# Patient Record
Sex: Female | Born: 1955 | Race: White | Hispanic: No | Marital: Married | Smoking: Never smoker
Health system: Southern US, Community
[De-identification: ages and names within clinical notes are randomized; demographics above are authoritative.]

## PROBLEM LIST (undated history)

## (undated) HISTORY — PX: WISDOM TOOTH EXTRACTION: SHX21

## (undated) HISTORY — PX: TUBAL LIGATION: SHX77

---

## 2002-08-17 DIAGNOSIS — K589 Irritable bowel syndrome without diarrhea: Secondary | ICD-10-CM | POA: Insufficient documentation

## 2005-08-05 ENCOUNTER — Ambulatory Visit: Payer: Self-pay | Admitting: Family Medicine

## 2006-11-30 ENCOUNTER — Ambulatory Visit: Payer: Self-pay | Admitting: Family Medicine

## 2008-05-03 ENCOUNTER — Ambulatory Visit: Payer: Self-pay | Admitting: Family Medicine

## 2008-12-14 ENCOUNTER — Emergency Department: Payer: Self-pay | Admitting: Emergency Medicine

## 2008-12-17 ENCOUNTER — Emergency Department: Payer: Self-pay | Admitting: Emergency Medicine

## 2008-12-21 ENCOUNTER — Emergency Department: Payer: Self-pay | Admitting: Internal Medicine

## 2008-12-28 ENCOUNTER — Emergency Department: Payer: Self-pay | Admitting: Emergency Medicine

## 2009-01-11 ENCOUNTER — Emergency Department: Payer: Self-pay | Admitting: Emergency Medicine

## 2009-03-07 ENCOUNTER — Ambulatory Visit: Payer: Self-pay | Admitting: Family Medicine

## 2009-05-16 ENCOUNTER — Ambulatory Visit: Payer: Self-pay | Admitting: Family Medicine

## 2009-08-13 ENCOUNTER — Ambulatory Visit: Payer: Self-pay | Admitting: General Surgery

## 2010-08-06 ENCOUNTER — Ambulatory Visit: Payer: Self-pay | Admitting: Family Medicine

## 2011-06-01 IMAGING — CR DG LUMBAR SPINE 2-3V
1 series · 3 of 3 positions shown · non-contrast
Comparison: none

REASON FOR EXAM: Zae Avalo pain
COMMENTS:

PROCEDURE:     KDR - KDXR LUMBAR SPINE AP AND LATERAL  - March 07, 2009  [DATE]
RESULT:     Spinal alignment is normal. The vertebral body heights are
maintained. There is no fracture or congenital abnormality evident.

[Series 2: view not recorded · 0.17mm/px · 3 of 3 slices shown]
[im 1/3]
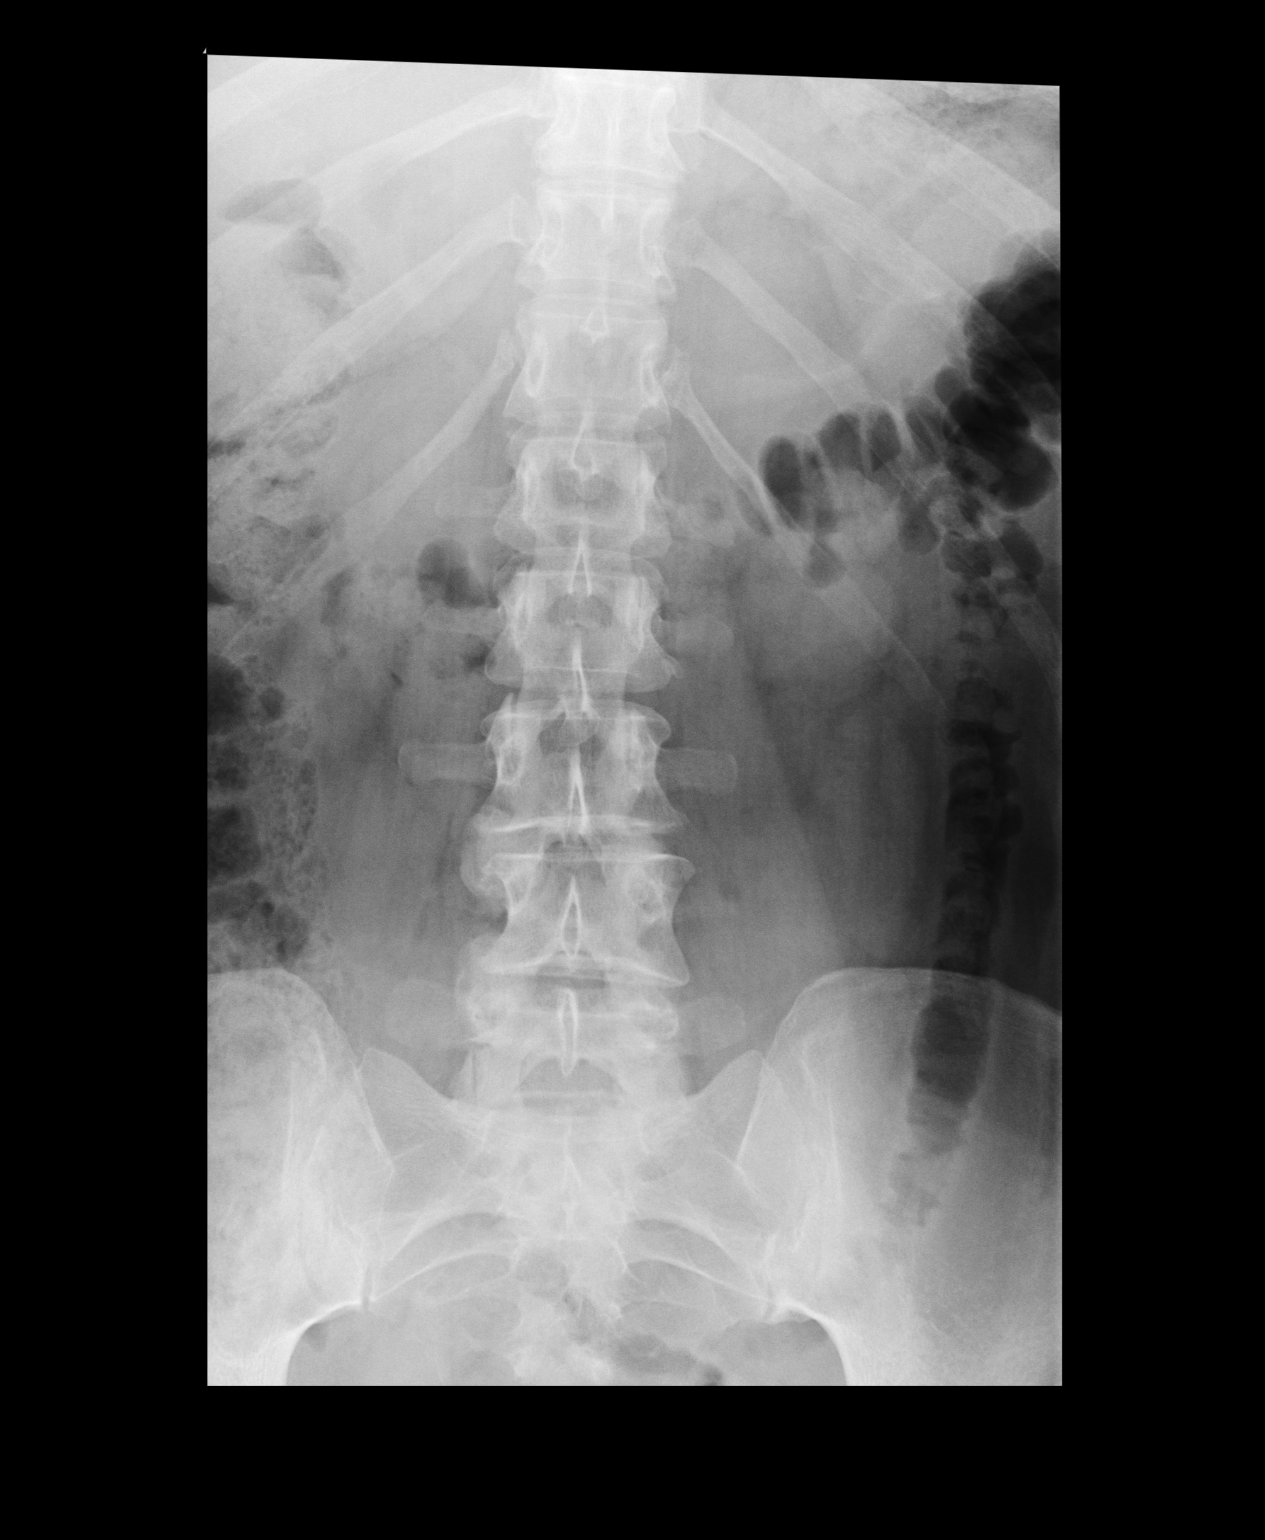
[im 2/3]
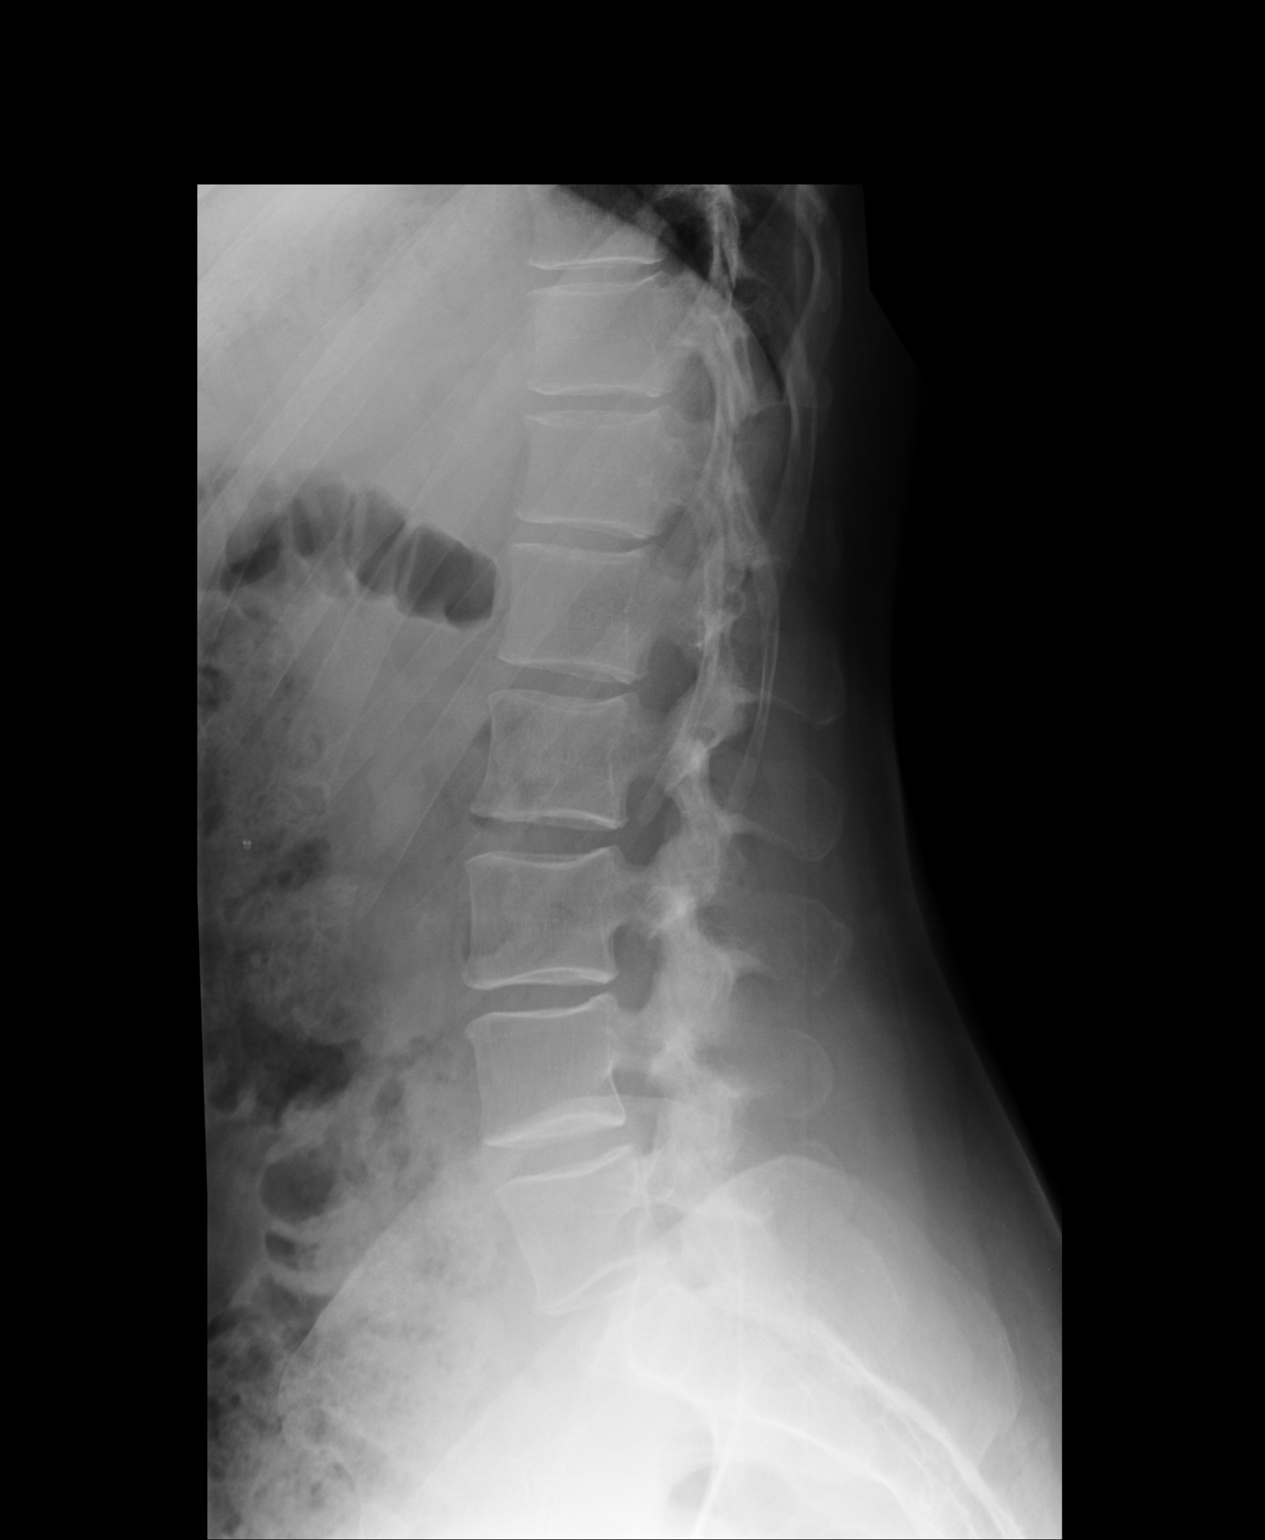
[im 3/3]
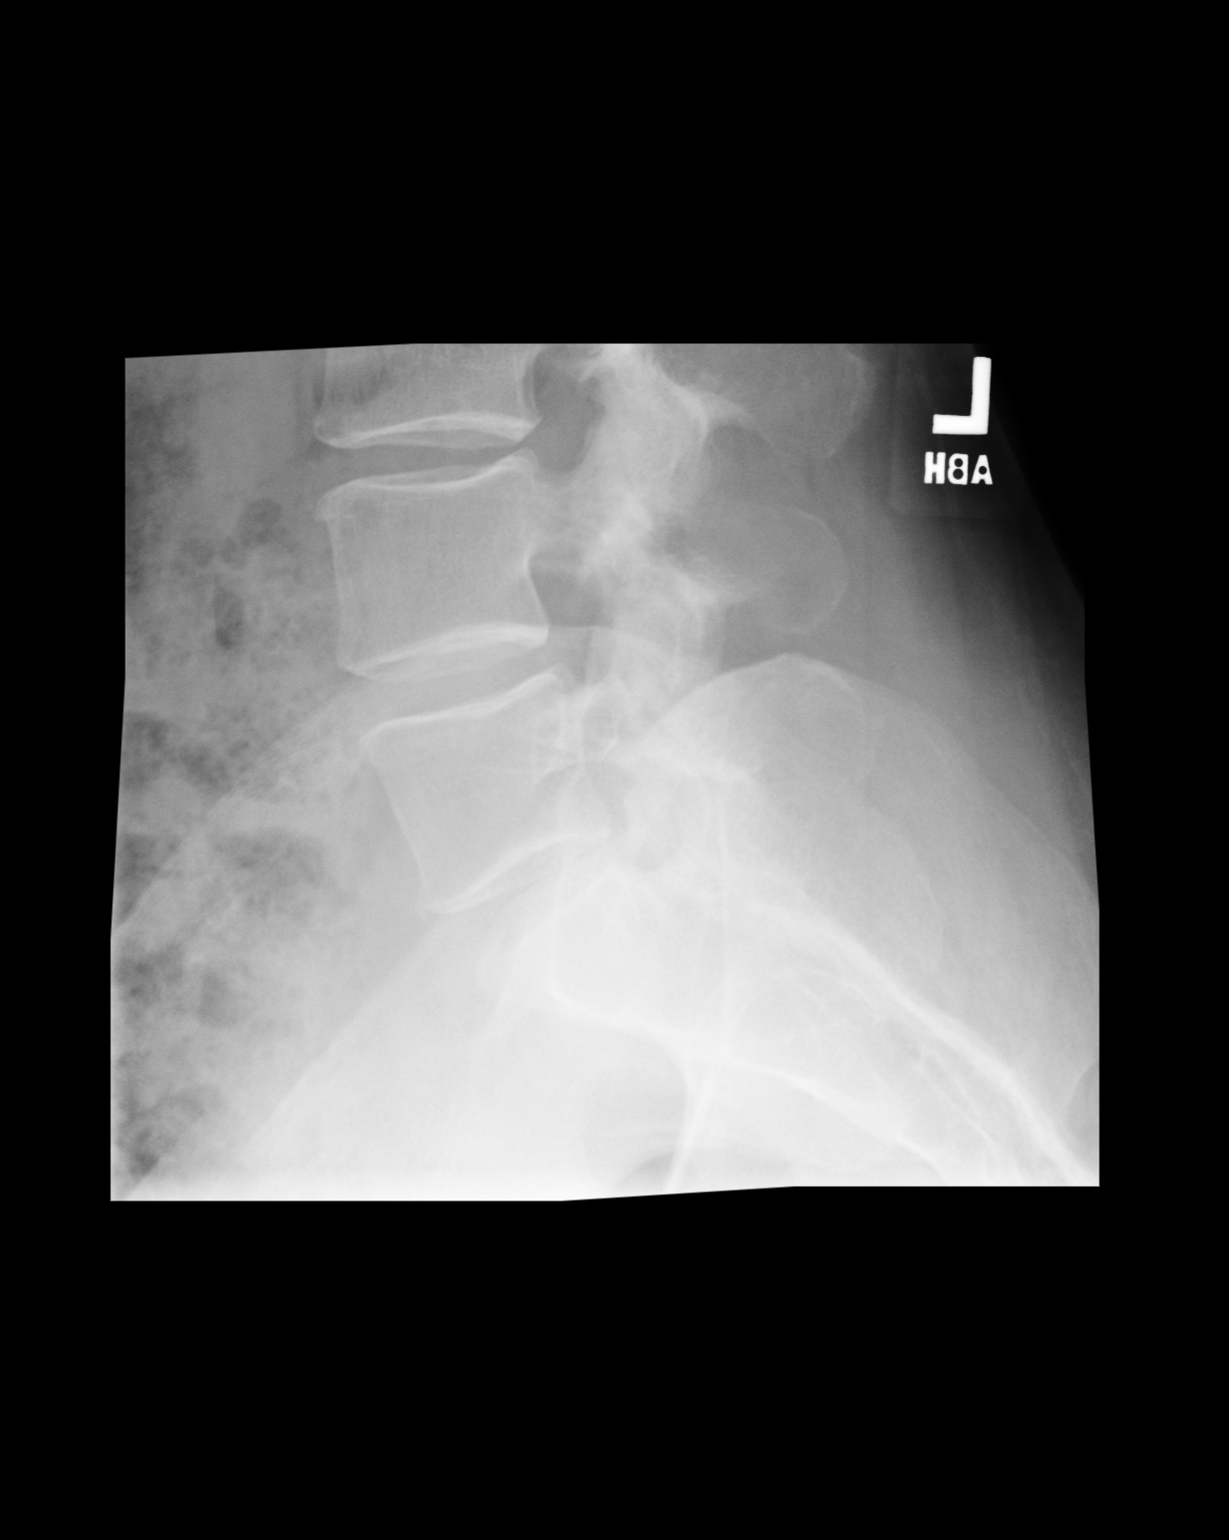

[3 of 3 positions shown; findings below may reference images not displayed]

IMPRESSION: No acute bony abnormality. MRI followup may be beneficial
area

## 2011-08-10 IMAGING — MG MAM DGTL SCREENING MAMMO W/CAD
1 series · 4 of 4 positions shown · non-contrast
Comparison: none

REASON FOR EXAM: scr
COMMENTS:

PROCEDURE:     MAM - MAM DGTL SCREENING MAMMO W/CAD  - May 16, 2009  [DATE]
RESULT:       Comparison is made to prior examinations of 05/03/08, 11/30/06 and
09/15/01.
There are scattered fibroglandular densities bilaterally.  No mass or
malignant-appearing calcifications are seen.

[R CC · right · 4 of 4 slices shown]
[im 1/4]
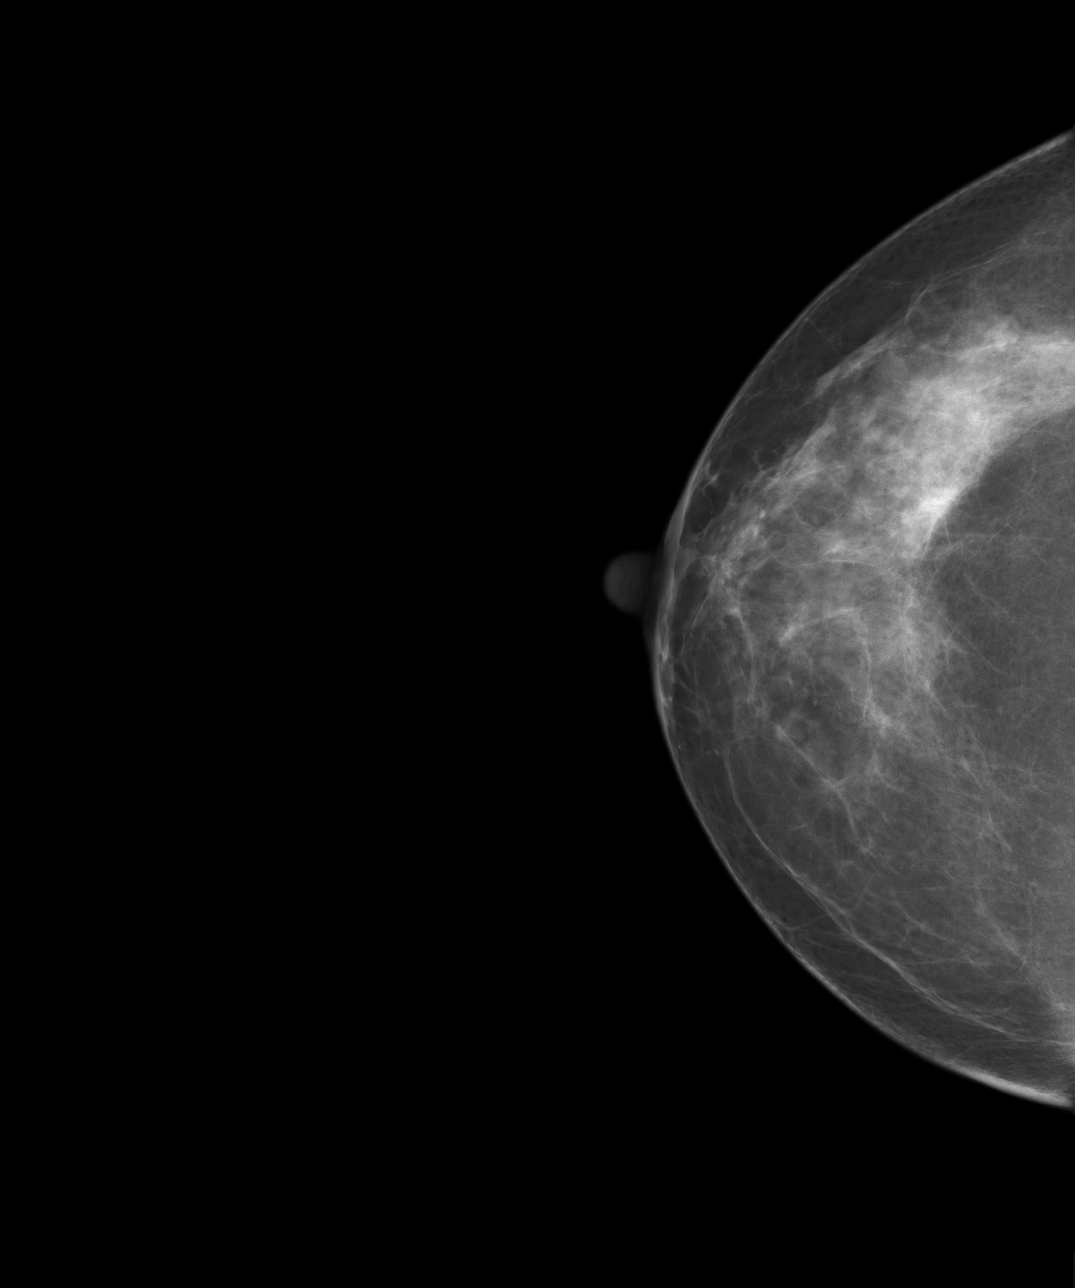
[im 2/4]
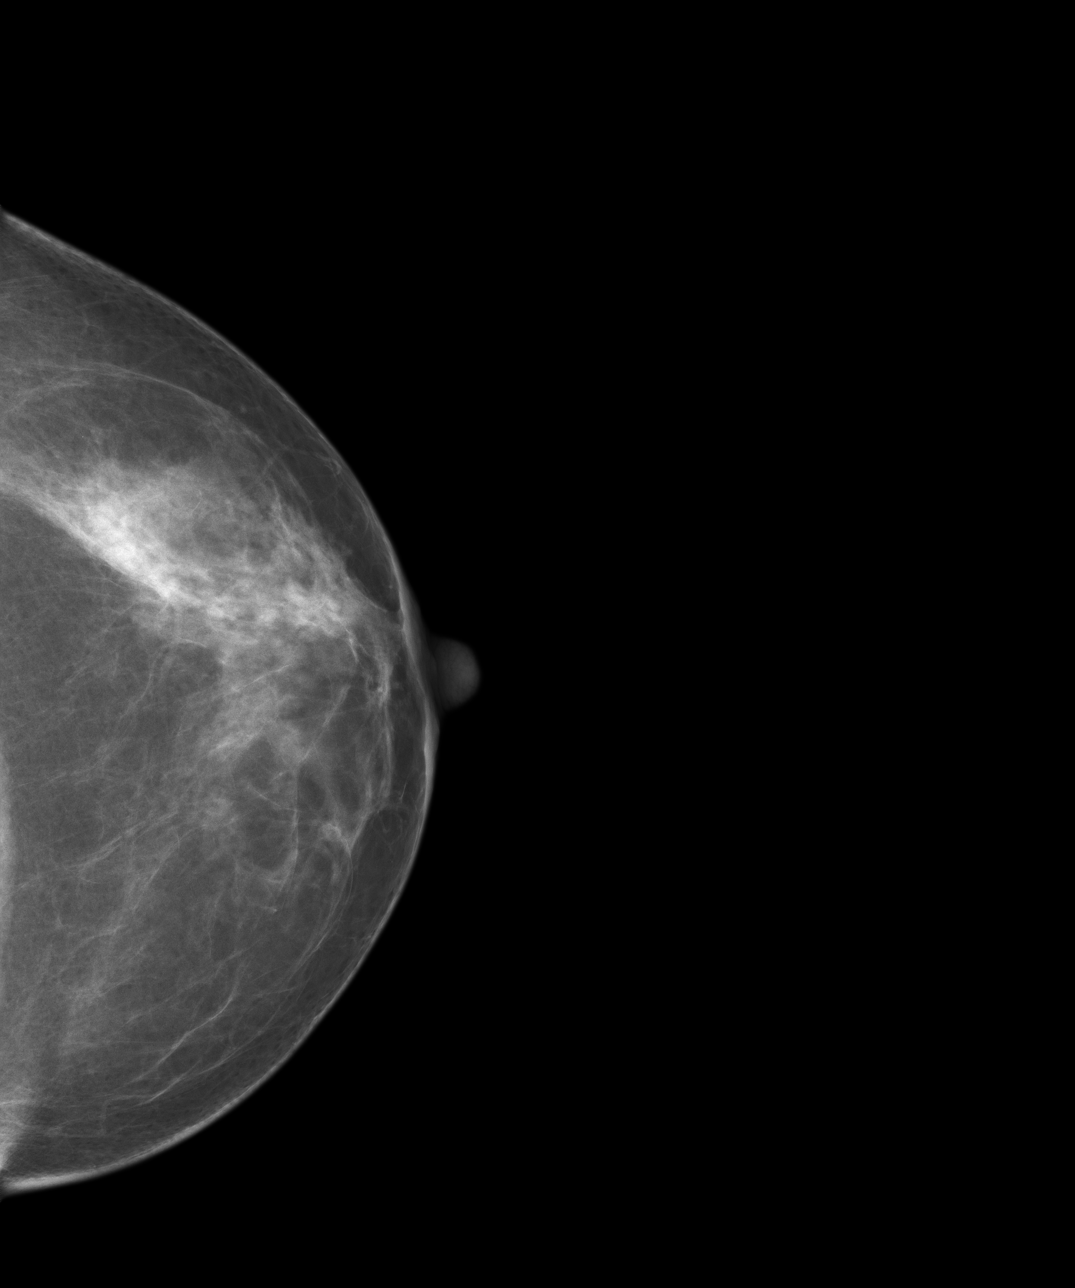
[im 3/4]
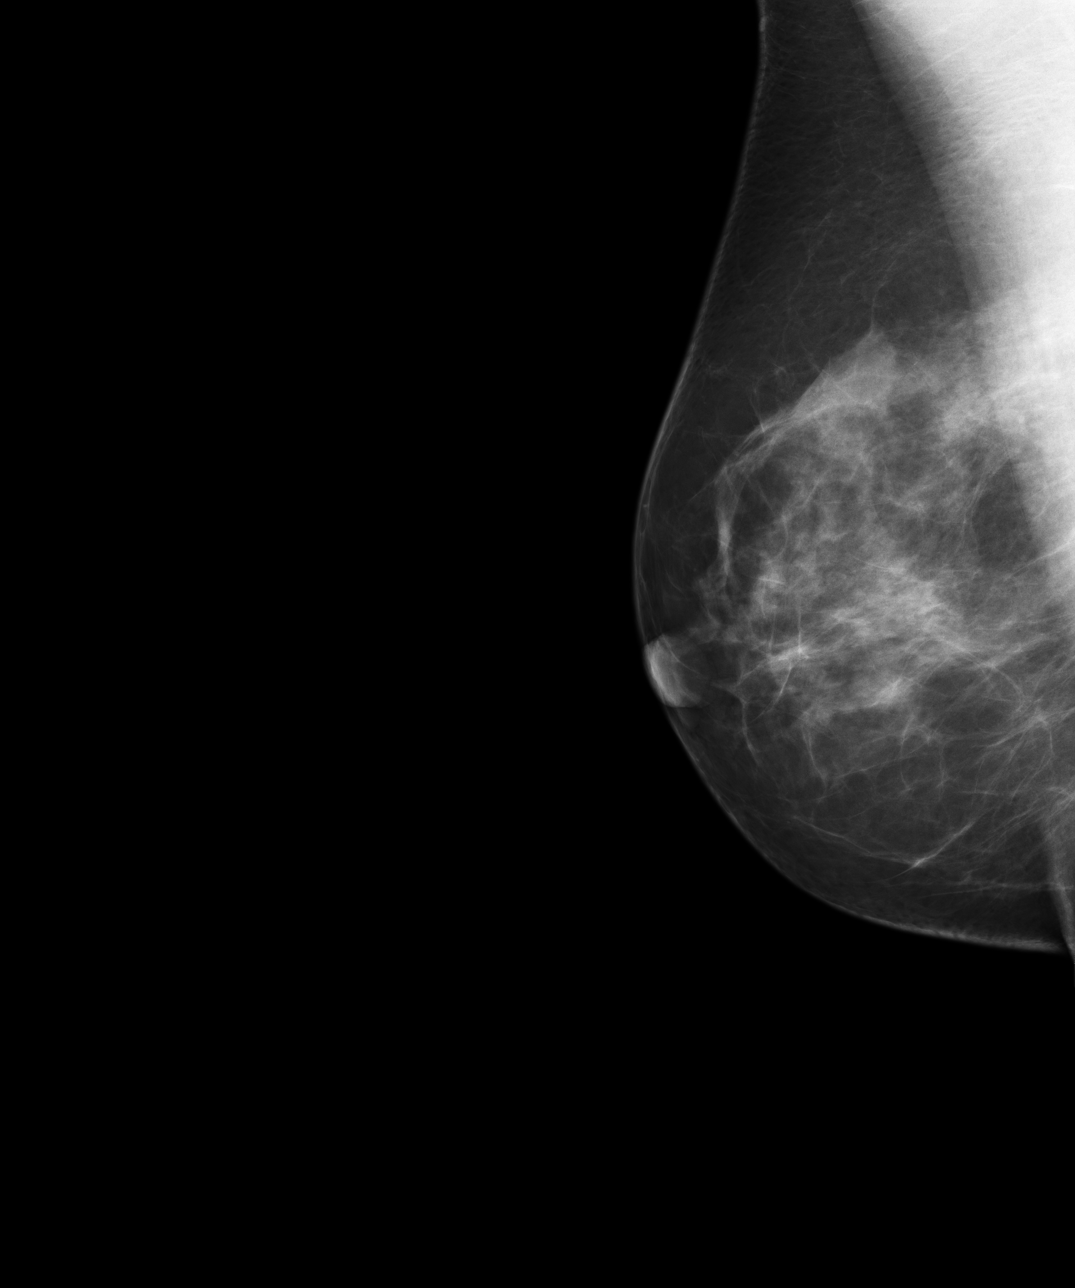
[im 4/4]
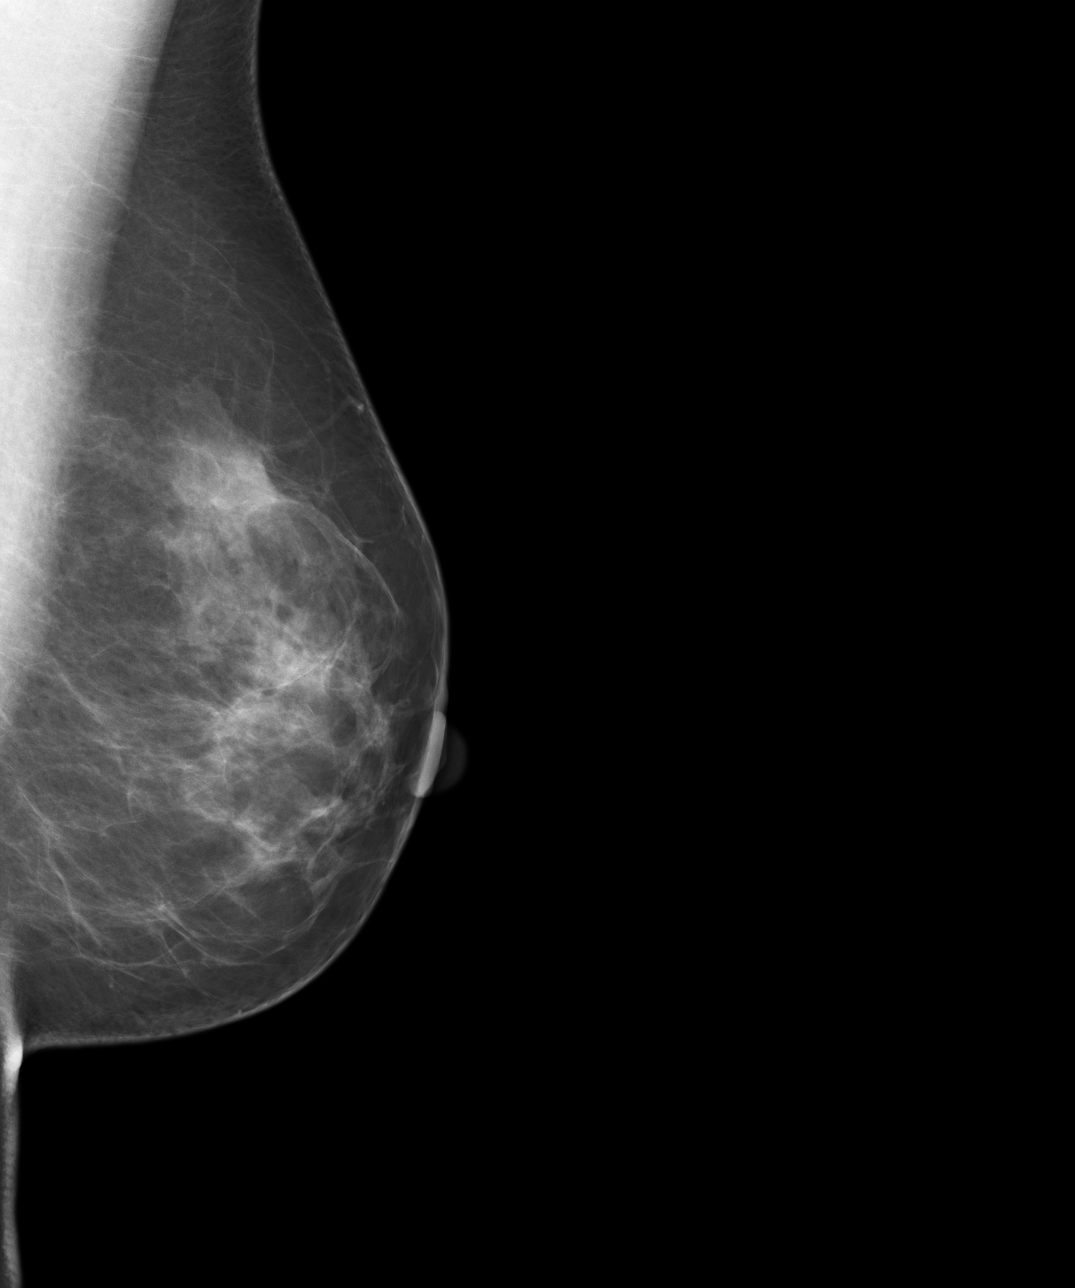

[4 of 4 positions shown; findings below may reference images not displayed]

IMPRESSION: 1.     Bilateral benign-appearing screening mammography.
2.     Continued annual screening mammography is recommended.
3.     BI-RADS: Category 1-Negative.

A negative mammogram report does not preclude biopsy or other evaluation of
a clinically palpable or otherwise suspicious mass or lesion. Breast cancer
may not be detected by mammography in up to 10% of cases.

## 2012-07-05 ENCOUNTER — Ambulatory Visit: Payer: Self-pay | Admitting: Family Medicine

## 2012-10-29 IMAGING — MG MAM DGTL SCREENING MAMMO W/CAD
1 series · 4 of 4 positions shown · non-contrast
Comparison: none

REASON FOR EXAM: scr
COMMENTS:

[R CC · right · 4 of 4 slices shown]
[im 1/4]
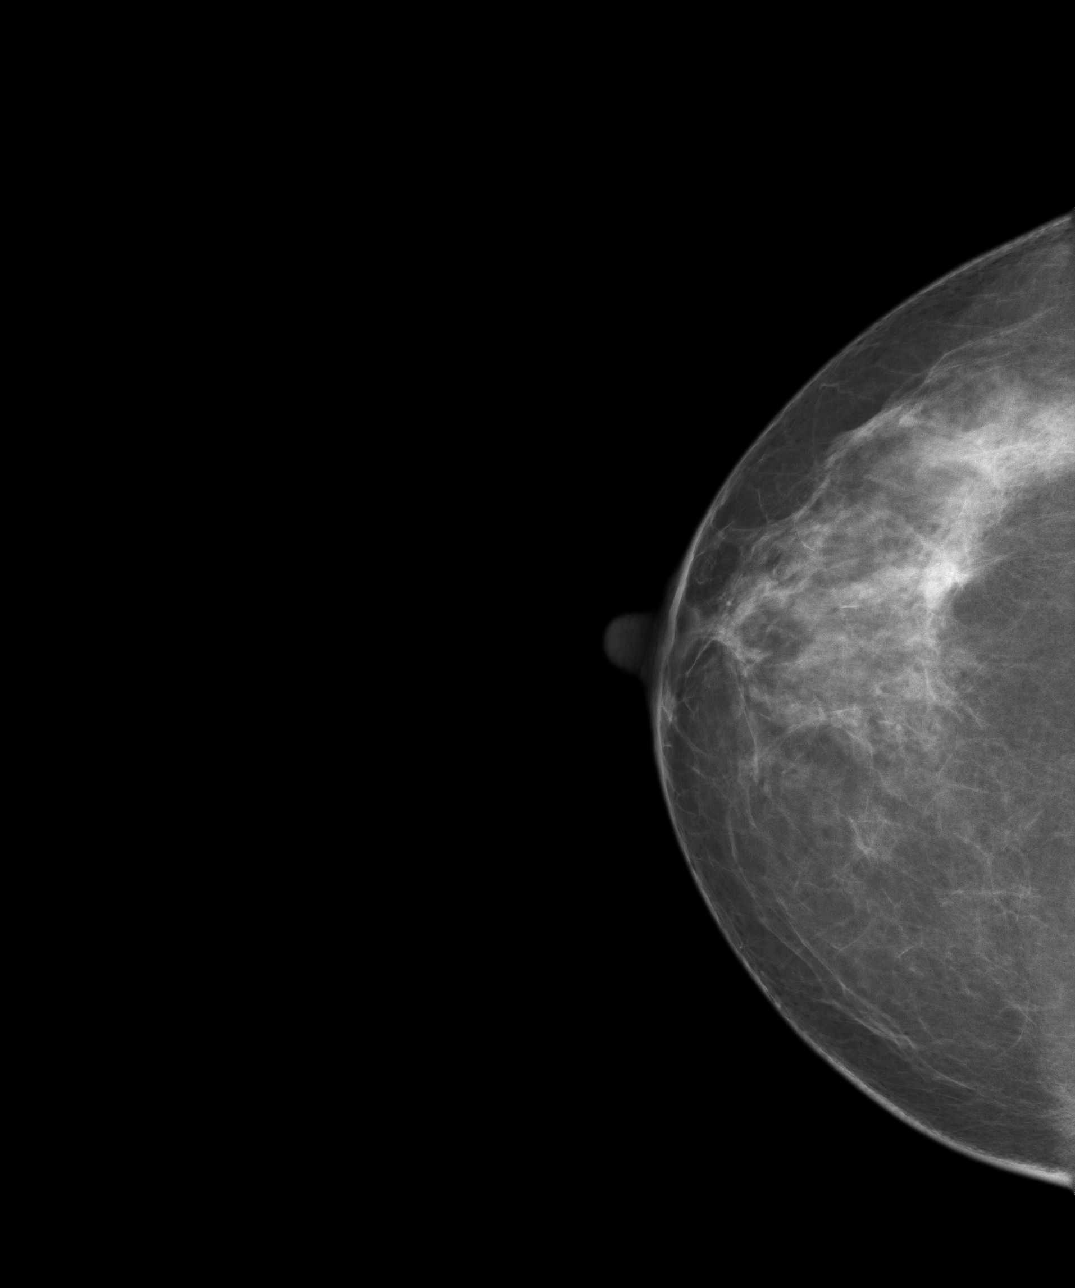
[im 2/4]
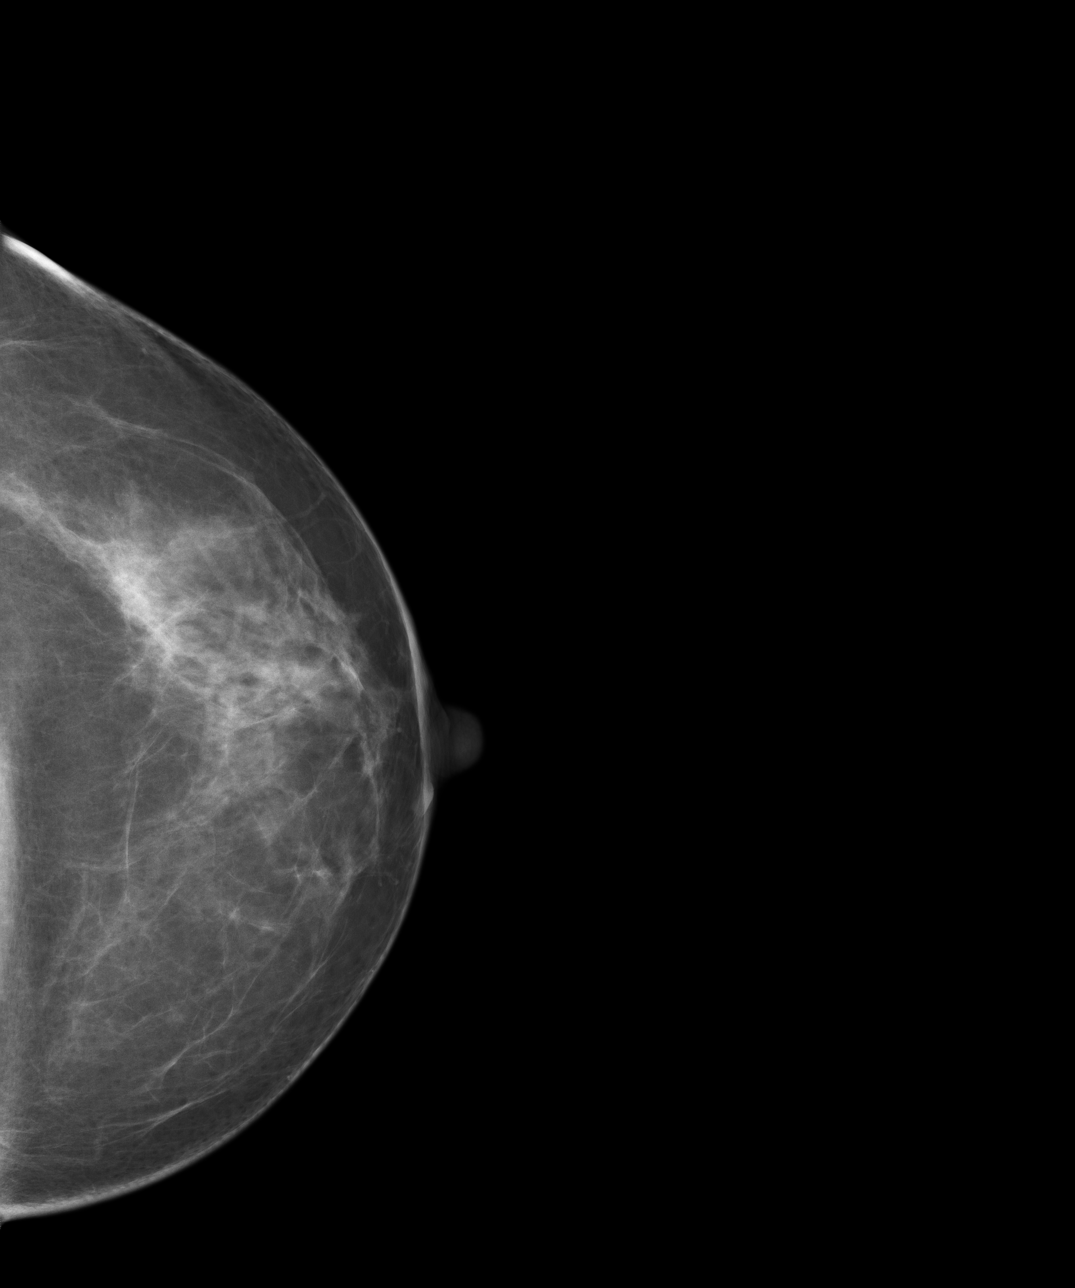
[im 3/4]
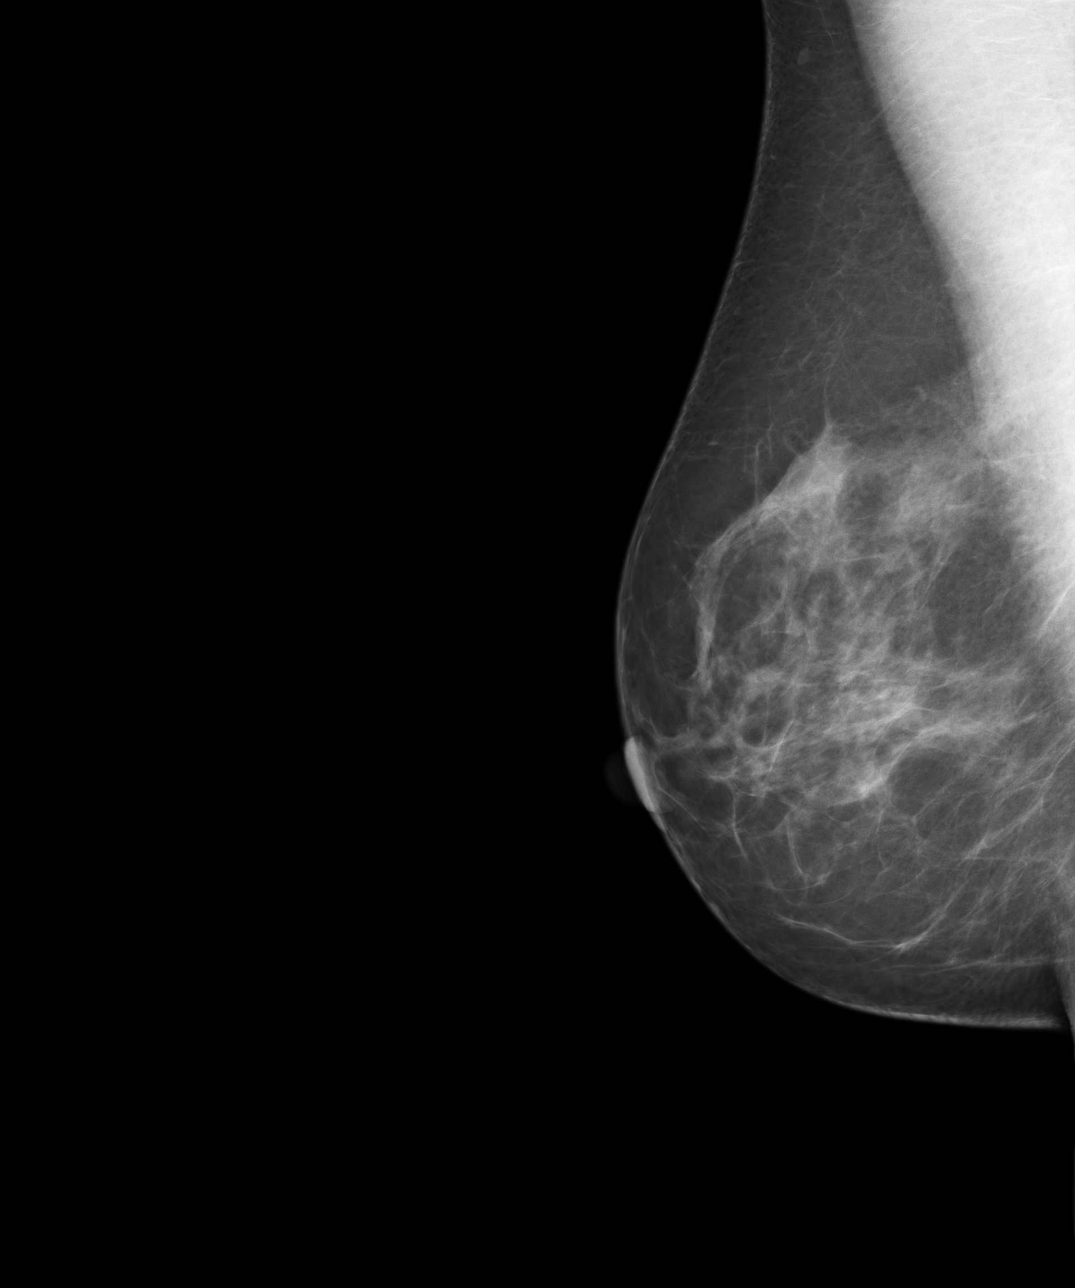
[im 4/4]
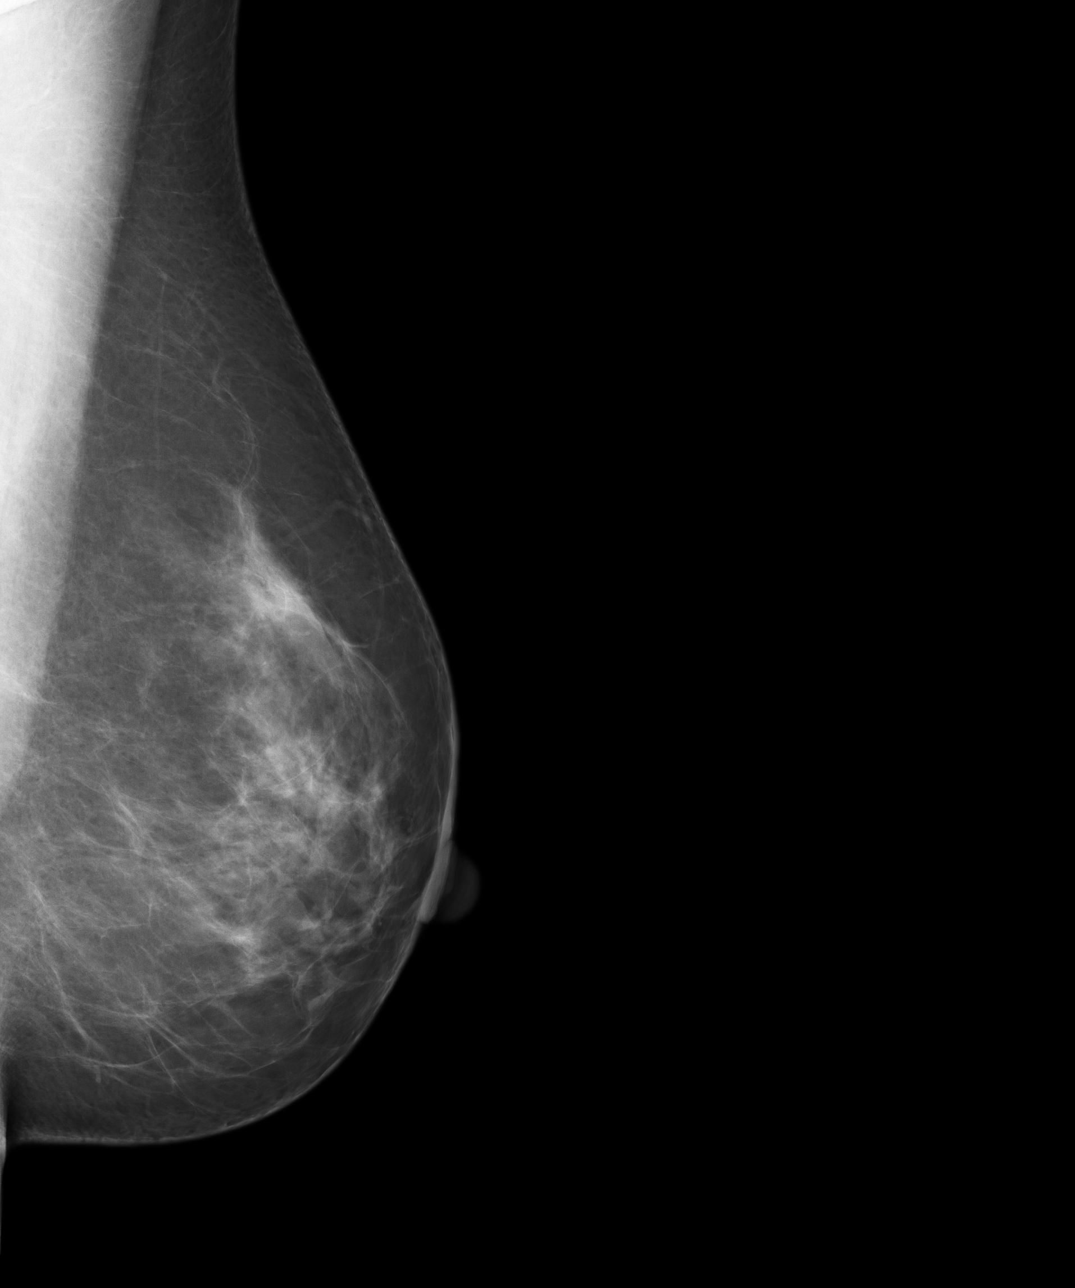

[4 of 4 positions shown; findings below may reference images not displayed]

PROCEDURE:     MAM - MAM DGTL SCREENING MAMMO W/CAD  - August 06, 2010  [DATE]

RESULT:     Comparison is made to the previous digital images of 05-16-09 as
well as 05-03-08 and 09-15-01.  The breasts exhibit a moderately dense
parenchymal pattern which is stable mammographically. There is no developing
parenchymal density or dominant mass. No malignant calcification or
architectural distortion is present.
IMPRESSION: 1.Stable, benign appearing bilateral mammogram.

BI-RADS: Category 2 - Benign Findings

RECOMMENDATIONS:

1.     Please continue to encourage yearly mammographic follow-up.

A NEGATIVE MAMMOGRAM REPORT DOES NOT PRECLUDE BIOPSY OR OTHER EVALUATION OF
A CLINICALLY PALPABLE OR OTHERWISE SUSPICIOUS MASS OR LESION. BREAST CANCER
MAY NOT BE DETECTED BY MAMMOGRAPHY IN UP TO 10% OF CASES.

## 2014-04-19 LAB — HEPATIC FUNCTION PANEL
ALT: 21 U/L (ref 7–35)
AST: 23 U/L (ref 13–35)
Alkaline Phosphatase: 92 U/L (ref 25–125)
BILIRUBIN, TOTAL: 0.4 mg/dL

## 2014-04-19 LAB — TSH: TSH: 2.2 u[IU]/mL (ref 0.41–5.90)

## 2014-04-19 LAB — CBC AND DIFFERENTIAL
HCT: 43 % (ref 36–46)
HEMOGLOBIN: 14.9 g/dL (ref 12.0–16.0)
Neutrophils Absolute: 4 /uL
Platelets: 222 10*3/uL (ref 150–399)
WBC: 5.9 10^3/mL

## 2014-04-19 LAB — BASIC METABOLIC PANEL
BUN: 13 mg/dL (ref 4–21)
CREATININE: 0.9 mg/dL (ref 0.5–1.1)
Glucose: 85 mg/dL
POTASSIUM: 4.4 mmol/L (ref 3.4–5.3)
Sodium: 140 mmol/L (ref 137–147)

## 2014-04-19 LAB — LIPID PANEL
Cholesterol: 207 mg/dL — AB (ref 0–200)
HDL: 67 mg/dL (ref 35–70)
LDL Cholesterol: 122 mg/dL
Triglycerides: 91 mg/dL (ref 40–160)

## 2014-05-15 ENCOUNTER — Ambulatory Visit: Payer: Self-pay | Admitting: Family Medicine

## 2015-08-02 ENCOUNTER — Encounter: Payer: Self-pay | Admitting: Family Medicine

## 2015-08-02 ENCOUNTER — Ambulatory Visit (INDEPENDENT_AMBULATORY_CARE_PROVIDER_SITE_OTHER): Payer: 59 | Admitting: Family Medicine

## 2015-08-02 VITALS — BP 100/70 | HR 80 | Temp 98.3°F | Resp 16 | Ht 63.0 in | Wt 134.0 lb

## 2015-08-02 DIAGNOSIS — Z0000 Encounter for general adult medical examination without abnormal findings: Secondary | ICD-10-CM | POA: Diagnosis not present

## 2015-08-02 DIAGNOSIS — Z1239 Encounter for other screening for malignant neoplasm of breast: Secondary | ICD-10-CM | POA: Diagnosis not present

## 2015-08-02 DIAGNOSIS — Z1211 Encounter for screening for malignant neoplasm of colon: Secondary | ICD-10-CM

## 2015-08-02 DIAGNOSIS — E559 Vitamin D deficiency, unspecified: Secondary | ICD-10-CM | POA: Insufficient documentation

## 2015-08-02 LAB — POCT URINALYSIS DIPSTICK
Bilirubin, UA: NEGATIVE
Blood, UA: NEGATIVE
GLUCOSE UA: NEGATIVE
Ketones, UA: NEGATIVE
Leukocytes, UA: NEGATIVE
NITRITE UA: NEGATIVE
PROTEIN UA: NEGATIVE
Spec Grav, UA: 1.015
UROBILINOGEN UA: 0.2
pH, UA: 6

## 2015-08-02 LAB — IFOBT (OCCULT BLOOD): IFOBT: NEGATIVE

## 2015-08-02 NOTE — Progress Notes (Signed)
Patient ID: Melissa Glass, female   DOB: July 01, 1955, 60 y.o.   MRN: [id number]       Patient: Melissa Glass, Female    DOB: July 01, 1955, 60 y.o.   MRN: [id number] Visit Date: 08/02/2015  Today's Provider: Lorie PhenixNancy Chauncey Bruno, MD   Chief Complaint  Patient presents with  . Annual Exam   Subjective:    Annual physical exam Melissa Meiersancy A Keep is a 60 y.o. female who presents today for health maintenance and complete physical. She feels well. She reports exercising daily. She reports she is sleeping well. 04/13/14 CPE 04/13/14 Pap-neg; HPV-neg 05/15/14 Mammogram-BI-RADS 1 08/13/09 Colonoscopy-diverticulosis, recheck in 10 yrs. Dr. Lemar LivingsByrnett -----------------------------------------------------------------  Review of Systems  Constitutional: Negative.   HENT: Negative.   Eyes: Negative.   Respiratory: Negative.   Cardiovascular: Negative.   Gastrointestinal: Negative.   Endocrine: Negative.   Genitourinary: Negative.   Musculoskeletal: Negative.   Skin: Negative.   Allergic/Immunologic: Negative.   Neurological: Negative.   Hematological: Negative.   Psychiatric/Behavioral: Negative.     Social History      She  reports that she has never smoked. She has never used smokeless tobacco. She reports that she drinks about 4.2 oz of alcohol per week. She reports that she does not use illicit drugs.       Social History   Social History  . Marital Status: Married    Spouse Name: N/A  . Number of Children: N/A  . Years of Education: N/A   Social History Main Topics  . Smoking status: Never Smoker   . Smokeless tobacco: Never Used  . Alcohol Use: 4.2 oz/week    7 Glasses of wine per week  . Drug Use: No  . Sexual Activity: Not Asked   Other Topics Concern  . None   Social History Narrative    History reviewed. No pertinent past medical history.   Patient Active Problem List   Diagnosis Date Noted  . Arthritis, degenerative 03/07/2009  . Menstrual molimen 05/24/2008  .  Menopausal symptom 05/24/2008  . 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 Tubal ligation    . Wisdom tooth extraction      Family History        Family Status  Relation Status Death Age  . Mother Alive   . Father Deceased   . Brother Alive   . Maternal Grandmother Deceased   . Maternal Grandfather Deceased   . Paternal Grandmother Deceased   . Paternal Grandfather Deceased   . Brother Alive         Her family history includes Alzheimer's disease in her paternal grandfather; Arthritis in her mother; Breast cancer in her mother; Heart attack in her paternal grandmother; Heart disease in her father; Hypertension in her brother and mother; Lupus in her mother; Parkinson's disease in her father; Pneumonia in her maternal grandfather; Stroke in her father.    Allergies  Allergen Reactions  . Hair Formula Extra Strength  [Actical] Swelling    Ingredient: Paraphenylenediamine  . Sulfa Antibiotics     Previous Medications   MISC NATURAL PRODUCTS (FIBER 7 PO)        Patient Care Team: Lorie PhenixNancy Adonys Wildes, MD as PCP - General (Family Medicine)     Objective:   Vitals: BP 100/70 mmHg  Pulse 80  Temp(Src) 98.3 F (36.8 C) (Oral)  Resp 16  Ht 5\' 3"  (1.6 m)  Wt 134 lb (60.782 kg)  BMI 23.74 kg/m2  Physical Exam  Constitutional: She is oriented to person, place, and time. She appears well-developed and well-nourished.  HENT:  Head: Normocephalic and atraumatic.  Right Ear: Tympanic membrane, external ear and ear canal normal.  Left Ear: Tympanic membrane, external ear and ear canal normal.  Nose: Nose normal.  Mouth/Throat: Uvula is midline, oropharynx is clear and moist and mucous membranes are normal.  Eyes: Conjunctivae, EOM and lids are normal. Pupils are equal, round, and reactive to light.  Neck: Trachea normal and normal range of motion. Neck supple. Carotid bruit is not present. No thyroid mass and no thyromegaly present.    Cardiovascular: Normal rate, regular rhythm and normal heart sounds.   Pulmonary/Chest: Effort normal and breath sounds normal.  Abdominal: Soft. Normal appearance and bowel sounds are normal. There is no hepatosplenomegaly. There is no tenderness.  Genitourinary: Rectum normal. No breast swelling, tenderness or discharge.  Musculoskeletal: Normal range of motion.  Lymphadenopathy:    She has no cervical adenopathy.    She has no axillary adenopathy.  Neurological: She is alert and oriented to person, place, and time. She has normal strength. No cranial nerve deficit.  Skin: Skin is warm, dry and intact.  Psychiatric: She has a normal mood and affect. Her speech is normal and behavior is normal. Judgment and thought content normal. Cognition and memory are normal.   Depression Screen PHQ 2/9 Scores 08/02/2015  PHQ - 2 Score 0   Assessment & Plan:     Routine Health Maintenance and Physical Exam  Exercise Activities and Dietary recommendations Goals    None      Immunization History  Administered Date(s) Administered  . Tdap 11/27/2005    1. Annual physical exam Stable. Patient advised to continue eating healthy and exercise daily. - POCT urinalysis dipstick - CBC with Differential/Platelet - Comprehensive Metabolic Panel (CMET) - Lipid panel - TSH  2. Breast cancer screening - MM DIGITAL SCREENING BILATERAL; Future  3. Colon cancer screening - IFOBT POC (occult bld, rslt in office)    Patient seen and examined by Dr. Leo Grosser, and note scribed by Liz Beach. Dimas, CMA.  I have reviewed the document for accuracy and completeness and I agree with above. Leo Grosser, MD   Lorie Phenix, MD    --------------------------------------------------------------------

## 2015-08-30 LAB — CBC WITH DIFFERENTIAL/PLATELET
Basophils Absolute: 0 10*3/uL (ref 0.0–0.2)
Basos: 0 %
EOS (ABSOLUTE): 0.1 10*3/uL (ref 0.0–0.4)
Eos: 2 %
HEMOGLOBIN: 14.8 g/dL (ref 11.1–15.9)
Hematocrit: 42.4 % (ref 34.0–46.6)
IMMATURE GRANS (ABS): 0 10*3/uL (ref 0.0–0.1)
Immature Granulocytes: 0 %
Lymphocytes Absolute: 1.3 10*3/uL (ref 0.7–3.1)
Lymphs: 25 %
MCH: 30.8 pg (ref 26.6–33.0)
MCHC: 34.9 g/dL (ref 31.5–35.7)
MCV: 88 fL (ref 79–97)
Monocytes Absolute: 0.4 10*3/uL (ref 0.1–0.9)
Monocytes: 8 %
NEUTROS ABS: 3.5 10*3/uL (ref 1.4–7.0)
Neutrophils: 65 %
Platelets: 217 10*3/uL (ref 150–379)
RBC: 4.81 x10E6/uL (ref 3.77–5.28)
RDW: 13.9 % (ref 12.3–15.4)
WBC: 5.3 10*3/uL (ref 3.4–10.8)

## 2015-08-30 LAB — COMPREHENSIVE METABOLIC PANEL
A/G RATIO: 2 (ref 1.2–2.2)
ALBUMIN: 4.3 g/dL (ref 3.5–5.5)
ALT: 27 IU/L (ref 0–32)
AST: 26 IU/L (ref 0–40)
Alkaline Phosphatase: 82 IU/L (ref 39–117)
BILIRUBIN TOTAL: 0.5 mg/dL (ref 0.0–1.2)
BUN / CREAT RATIO: 17 (ref 9–23)
BUN: 12 mg/dL (ref 6–24)
CHLORIDE: 104 mmol/L (ref 96–106)
CO2: 22 mmol/L (ref 18–29)
Calcium: 9.2 mg/dL (ref 8.7–10.2)
Creatinine, Ser: 0.72 mg/dL (ref 0.57–1.00)
GFR calc non Af Amer: 93 mL/min/{1.73_m2} (ref >59)
GFR, EST AFRICAN AMERICAN: 107 mL/min/{1.73_m2} (ref >59)
Globulin, Total: 2.2 g/dL (ref 1.5–4.5)
Glucose: 89 mg/dL (ref 65–99)
POTASSIUM: 4.1 mmol/L (ref 3.5–5.2)
Sodium: 143 mmol/L (ref 134–144)
TOTAL PROTEIN: 6.5 g/dL (ref 6.0–8.5)

## 2015-08-30 LAB — LIPID PANEL
CHOL/HDL RATIO: 3.6 ratio (ref 0.0–4.4)
Cholesterol, Total: 224 mg/dL — ABNORMAL HIGH (ref 100–199)
HDL: 63 mg/dL (ref >39)
LDL Calculated: 134 mg/dL — ABNORMAL HIGH (ref 0–99)
Triglycerides: 136 mg/dL (ref 0–149)
VLDL CHOLESTEROL CAL: 27 mg/dL (ref 5–40)

## 2015-08-30 LAB — TSH: TSH: 2.39 u[IU]/mL (ref 0.450–4.500)

## 2015-09-03 ENCOUNTER — Telehealth: Payer: Self-pay

## 2015-09-03 NOTE — Telephone Encounter (Signed)
lmtcb Leonid Manus Drozdowski, CMA  

## 2015-09-03 NOTE — Telephone Encounter (Signed)
-----   Message from Lorie PhenixNancy Maloney, MD sent at 09/01/2015  1:06 PM EDT ----- Labs stable. Cholesterol is mildly elevated but balanced by elevated good cholesterol.   Thanks.

## 2015-09-10 ENCOUNTER — Ambulatory Visit
Admission: RE | Admit: 2015-09-10 | Discharge: 2015-09-10 | Disposition: A | Discharge status: Home / Self Care | Payer: 59 | Source: Ambulatory Visit | Attending: Family Medicine | Admitting: Family Medicine

## 2015-09-10 DIAGNOSIS — Z1231 Encounter for screening mammogram for malignant neoplasm of breast: Secondary | ICD-10-CM | POA: Diagnosis present

## 2015-09-10 DIAGNOSIS — Z1239 Encounter for other screening for malignant neoplasm of breast: Secondary | ICD-10-CM

## 2015-09-10 NOTE — Telephone Encounter (Signed)
Pt advised on voicemail-aa 

## 2019-05-29 ENCOUNTER — Other Ambulatory Visit: Payer: Self-pay | Admitting: Family Medicine

## 2019-05-29 DIAGNOSIS — Z1231 Encounter for screening mammogram for malignant neoplasm of breast: Secondary | ICD-10-CM

## 2020-02-26 ENCOUNTER — Ambulatory Visit: Payer: Self-pay

## 2020-02-26 ENCOUNTER — Ambulatory Visit: Payer: BLUE CROSS/BLUE SHIELD | Attending: Internal Medicine

## 2020-02-26 DIAGNOSIS — Z23 Encounter for immunization: Secondary | ICD-10-CM

## 2020-02-26 NOTE — Progress Notes (Signed)
   Covid-19 Vaccination [facility]  Name:  TYKIA MELLONE    MRN: [id number] DOB: [birth year]  02/26/2020  Ms. Racanelli was observed post Covid-19 immunization for 15 minutes without incident. She was provided with Vaccine Information Sheet and instruction to access the V-Safe system.   Ms. Canales was instructed to call 911 with any severe reactions post vaccine: Marland Kitchen Difficulty breathing  . Swelling of face and throat  . A fast heartbeat  . A bad rash all over body  . Dizziness and weakness   Immunizations Administered    No immunizations on file.

## 2023-08-06 ENCOUNTER — Other Ambulatory Visit: Payer: Self-pay | Admitting: Family Medicine

## 2023-08-06 DIAGNOSIS — Z1231 Encounter for screening mammogram for malignant neoplasm of breast: Secondary | ICD-10-CM

## 2023-08-25 ENCOUNTER — Inpatient Hospital Stay
Admission: RE | Admit: 2023-08-25 | Discharge: 2023-08-25 | Disposition: A | Discharge status: Home / Self Care | Payer: Self-pay | Source: Ambulatory Visit | Attending: Family Medicine | Admitting: Family Medicine

## 2023-08-25 ENCOUNTER — Other Ambulatory Visit: Payer: Self-pay | Admitting: *Deleted

## 2023-08-25 DIAGNOSIS — Z1231 Encounter for screening mammogram for malignant neoplasm of breast: Secondary | ICD-10-CM

## 2023-08-26 ENCOUNTER — Encounter: Payer: Self-pay | Admitting: Radiology

## 2023-08-26 ENCOUNTER — Ambulatory Visit
Admission: RE | Admit: 2023-08-26 | Discharge: 2023-08-26 | Disposition: A | Discharge status: Home / Self Care | Source: Ambulatory Visit | Attending: Family Medicine | Admitting: Family Medicine

## 2023-08-26 DIAGNOSIS — Z1231 Encounter for screening mammogram for malignant neoplasm of breast: Secondary | ICD-10-CM | POA: Insufficient documentation
